# Patient Record
Sex: Female | Born: 1935 | Race: White | Hispanic: No | Smoking: Never smoker
Health system: Southern US, Community
[De-identification: ages and names within clinical notes are randomized; demographics above are authoritative.]

## PROBLEM LIST (undated history)

## (undated) DIAGNOSIS — C4490 Unspecified malignant neoplasm of skin, unspecified: Secondary | ICD-10-CM

## (undated) DIAGNOSIS — R296 Repeated falls: Secondary | ICD-10-CM

## (undated) DIAGNOSIS — E119 Type 2 diabetes mellitus without complications: Secondary | ICD-10-CM

## (undated) DIAGNOSIS — E785 Hyperlipidemia, unspecified: Secondary | ICD-10-CM

## (undated) DIAGNOSIS — M1990 Unspecified osteoarthritis, unspecified site: Secondary | ICD-10-CM

## (undated) DIAGNOSIS — R251 Tremor, unspecified: Secondary | ICD-10-CM

## (undated) DIAGNOSIS — I10 Essential (primary) hypertension: Secondary | ICD-10-CM

## (undated) DIAGNOSIS — K219 Gastro-esophageal reflux disease without esophagitis: Secondary | ICD-10-CM

## (undated) DIAGNOSIS — G43909 Migraine, unspecified, not intractable, without status migrainosus: Secondary | ICD-10-CM

## (undated) DIAGNOSIS — F419 Anxiety disorder, unspecified: Secondary | ICD-10-CM

## (undated) DIAGNOSIS — Z8489 Family history of other specified conditions: Secondary | ICD-10-CM

## (undated) HISTORY — PX: BREAST BIOPSY: SHX20

## (undated) HISTORY — PX: SKIN CANCER EXCISION: SHX779

## (undated) HISTORY — PX: CATARACT EXTRACTION W/ INTRAOCULAR LENS  IMPLANT, BILATERAL: SHX1307

## (undated) HISTORY — PX: APPENDECTOMY: SHX54

## (undated) HISTORY — PX: BLADDER SUSPENSION: SHX72

## (undated) HISTORY — PX: TONSILLECTOMY: SUR1361

## (undated) HISTORY — PX: ABDOMINAL HYSTERECTOMY: SHX81

## (undated) HISTORY — PX: LAPAROSCOPIC CHOLECYSTECTOMY: SUR755

## (undated) HISTORY — PX: DILATION AND CURETTAGE OF UTERUS: SHX78

---

## 2015-09-27 ENCOUNTER — Emergency Department (HOSPITAL_BASED_OUTPATIENT_CLINIC_OR_DEPARTMENT_OTHER): Payer: Medicare Other

## 2015-09-27 ENCOUNTER — Encounter (HOSPITAL_BASED_OUTPATIENT_CLINIC_OR_DEPARTMENT_OTHER): Payer: Self-pay | Admitting: Emergency Medicine

## 2015-09-27 ENCOUNTER — Observation Stay (HOSPITAL_BASED_OUTPATIENT_CLINIC_OR_DEPARTMENT_OTHER)
Admission: EM | Admit: 2015-09-27 | Discharge: 2015-09-29 | Disposition: A | Discharge status: Home / Self Care | Payer: Medicare Other | Attending: Internal Medicine | Admitting: Internal Medicine

## 2015-09-27 DIAGNOSIS — Z79899 Other long term (current) drug therapy: Secondary | ICD-10-CM | POA: Diagnosis not present

## 2015-09-27 DIAGNOSIS — M13849 Other specified arthritis, unspecified hand: Secondary | ICD-10-CM | POA: Diagnosis not present

## 2015-09-27 DIAGNOSIS — K219 Gastro-esophageal reflux disease without esophagitis: Secondary | ICD-10-CM | POA: Diagnosis not present

## 2015-09-27 DIAGNOSIS — G43909 Migraine, unspecified, not intractable, without status migrainosus: Secondary | ICD-10-CM | POA: Diagnosis not present

## 2015-09-27 DIAGNOSIS — R51 Headache: Secondary | ICD-10-CM | POA: Diagnosis present

## 2015-09-27 DIAGNOSIS — R471 Dysarthria and anarthria: Secondary | ICD-10-CM

## 2015-09-27 DIAGNOSIS — F419 Anxiety disorder, unspecified: Secondary | ICD-10-CM | POA: Diagnosis not present

## 2015-09-27 DIAGNOSIS — R519 Headache, unspecified: Secondary | ICD-10-CM

## 2015-09-27 DIAGNOSIS — Z85828 Personal history of other malignant neoplasm of skin: Secondary | ICD-10-CM | POA: Diagnosis not present

## 2015-09-27 DIAGNOSIS — G459 Transient cerebral ischemic attack, unspecified: Secondary | ICD-10-CM | POA: Diagnosis present

## 2015-09-27 DIAGNOSIS — E785 Hyperlipidemia, unspecified: Secondary | ICD-10-CM | POA: Insufficient documentation

## 2015-09-27 DIAGNOSIS — I10 Essential (primary) hypertension: Secondary | ICD-10-CM | POA: Diagnosis not present

## 2015-09-27 DIAGNOSIS — E119 Type 2 diabetes mellitus without complications: Secondary | ICD-10-CM | POA: Insufficient documentation

## 2015-09-27 DIAGNOSIS — R29818 Other symptoms and signs involving the nervous system: Secondary | ICD-10-CM | POA: Diagnosis present

## 2015-09-27 HISTORY — DX: Repeated falls: R29.6

## 2015-09-27 HISTORY — DX: Tremor, unspecified: R25.1

## 2015-09-27 HISTORY — DX: Family history of other specified conditions: Z84.89

## 2015-09-27 HISTORY — DX: Migraine, unspecified, not intractable, without status migrainosus: G43.909

## 2015-09-27 HISTORY — DX: Unspecified osteoarthritis, unspecified site: M19.90

## 2015-09-27 HISTORY — DX: Unspecified malignant neoplasm of skin, unspecified: C44.90

## 2015-09-27 HISTORY — DX: Anxiety disorder, unspecified: F41.9

## 2015-09-27 HISTORY — DX: Essential (primary) hypertension: I10

## 2015-09-27 HISTORY — DX: Hyperlipidemia, unspecified: E78.5

## 2015-09-27 HISTORY — DX: Gastro-esophageal reflux disease without esophagitis: K21.9

## 2015-09-27 HISTORY — DX: Type 2 diabetes mellitus without complications: E11.9

## 2015-09-27 LAB — COMPREHENSIVE METABOLIC PANEL
ALBUMIN: 3.9 g/dL (ref 3.5–5.0)
ALK PHOS: 100 U/L (ref 38–126)
ALT: 19 U/L (ref 14–54)
ANION GAP: 5 (ref 5–15)
AST: 22 U/L (ref 15–41)
BUN: 17 mg/dL (ref 6–20)
CALCIUM: 8.8 mg/dL — AB (ref 8.9–10.3)
CO2: 27 mmol/L (ref 22–32)
Chloride: 102 mmol/L (ref 101–111)
Creatinine, Ser: 0.68 mg/dL (ref 0.44–1.00)
GFR calc non Af Amer: >60 mL/min (ref >60)
GLUCOSE: 163 mg/dL — AB (ref 65–99)
POTASSIUM: 4 mmol/L (ref 3.5–5.1)
SODIUM: 134 mmol/L — AB (ref 135–145)
TOTAL PROTEIN: 6.9 g/dL (ref 6.5–8.1)
Total Bilirubin: 0.4 mg/dL (ref 0.3–1.2)

## 2015-09-27 LAB — CBC
HCT: 34.9 % — ABNORMAL LOW (ref 36.0–46.0)
Hemoglobin: 11.5 g/dL — ABNORMAL LOW (ref 12.0–15.0)
MCH: 31.6 pg (ref 26.0–34.0)
MCHC: 33 g/dL (ref 30.0–36.0)
MCV: 95.9 fL (ref 78.0–100.0)
PLATELETS: 174 10*3/uL (ref 150–400)
RBC: 3.64 MIL/uL — ABNORMAL LOW (ref 3.87–5.11)
RDW: 12.5 % (ref 11.5–15.5)
WBC: 5.4 10*3/uL (ref 4.0–10.5)

## 2015-09-27 LAB — DIFFERENTIAL
Basophils Absolute: 0 10*3/uL (ref 0.0–0.1)
Basophils Relative: 0 %
EOS ABS: 0.1 10*3/uL (ref 0.0–0.7)
EOS PCT: 3 %
Lymphocytes Relative: 27 %
Lymphs Abs: 1.5 10*3/uL (ref 0.7–4.0)
MONO ABS: 0.4 10*3/uL (ref 0.1–1.0)
Monocytes Relative: 7 %
NEUTROS PCT: 63 %
Neutro Abs: 3.4 10*3/uL (ref 1.7–7.7)

## 2015-09-27 LAB — URINALYSIS, ROUTINE W REFLEX MICROSCOPIC
Bilirubin Urine: NEGATIVE
GLUCOSE, UA: NEGATIVE mg/dL
HGB URINE DIPSTICK: NEGATIVE
Ketones, ur: NEGATIVE mg/dL
Nitrite: NEGATIVE
PH: 6.5 (ref 5.0–8.0)
Protein, ur: NEGATIVE mg/dL
Specific Gravity, Urine: 1.009 (ref 1.005–1.030)
Urobilinogen, UA: 0.2 mg/dL (ref 0.0–1.0)

## 2015-09-27 LAB — TROPONIN I: Troponin I: <0.03 ng/mL (ref <0.031)

## 2015-09-27 LAB — RAPID URINE DRUG SCREEN, HOSP PERFORMED
AMPHETAMINES: NOT DETECTED
BARBITURATES: NOT DETECTED
BENZODIAZEPINES: NOT DETECTED
COCAINE: NOT DETECTED
Opiates: NOT DETECTED
Tetrahydrocannabinol: NOT DETECTED

## 2015-09-27 LAB — PROTIME-INR
INR: 1 (ref 0.00–1.49)
PROTHROMBIN TIME: 13.4 s (ref 11.6–15.2)

## 2015-09-27 LAB — ETHANOL

## 2015-09-27 LAB — URINE MICROSCOPIC-ADD ON

## 2015-09-27 LAB — APTT: aPTT: 32 seconds (ref 24–37)

## 2015-09-27 MED ORDER — DIPHENHYDRAMINE HCL 50 MG/ML IJ SOLN
25.0000 mg | Freq: Once | INTRAMUSCULAR | Status: DC
  Filled 2015-09-27: qty 1

## 2015-09-27 MED ORDER — SODIUM CHLORIDE 0.9 % IV BOLUS (SEPSIS)
1000.0000 mL | Freq: Once | INTRAVENOUS | Status: AC
  Administered 2015-09-27: 1000 mL via INTRAVENOUS

## 2015-09-27 MED ORDER — DEXAMETHASONE SODIUM PHOSPHATE 10 MG/ML IJ SOLN
10.0000 mg | Freq: Once | INTRAMUSCULAR | Status: DC
  Filled 2015-09-27: qty 1

## 2015-09-27 MED ORDER — METOCLOPRAMIDE HCL 5 MG/ML IJ SOLN
10.0000 mg | Freq: Once | INTRAMUSCULAR | Status: DC
  Filled 2015-09-27: qty 2

## 2015-09-27 NOTE — ED Provider Notes (Signed)
CSN: [id number]     Arrival date & time 09/27/15  1916 History  By signing my name below, I, Tula Nakayama, attest that this documentation has been prepared under the direction and in the presence of Merrily Pew, MD.  Electronically Signed: Tula Nakayama, ED Scribe. 09/27/2015. 7:34 PM.  Chief Complaint  Patient presents with  . Cerebrovascular Accident   The history is provided by the patient and a relative. No language interpreter was used.   HPI Comments: Novis League is a 81 y.o. female with a history of DM and HTN, brought in by her son who presents to the Emergency Department complaining of intermittent, moderate slurred speech that started within the last 2 hours. She states a "heavy" feeling in her head as an associated symptom. Pt also reports  HA behind her left-eye and blurred vision, which occurred with the speech difficulty but are resolved.   Pt's son reports he last talked to her 2 hours ago and she was speaking normally. Her neighbor called 1 hour and 15 minutes ago and told him that she had slurred speech and difficulty forming sentences. He states that she is speaking normally now. Pt reports a history of migraines that normally occur behind her right eye. She notes that blurred vision and slurred speech are consistent with prior migraines, but states she has not had a similar headache in a long time. Pt's son also notes healing bruises on her forehead after she fell onto pavement 1 week ago. She was seen by [facility] after the fall who did a CT Head and an x-ray of her left arm, which were normal. Pt denies a history of CVA. She also denies any current pain.   Past Medical History  Diagnosis Date  . Diabetes mellitus without complication ([place])   . Hypertension    Past Surgical History  Procedure Laterality Date  . Cholecystectomy    . Abdominal hysterectomy    . Bladder tack    . Tremors     History reviewed. No pertinent family history. Social History  Substance  Use Topics  . Smoking status: Never Smoker   . Smokeless tobacco: Never Used  . Alcohol Use: No   OB History    No data available     Review of Systems  Eyes: Positive for visual disturbance.  Skin: Positive for wound.  Neurological: Positive for speech difficulty and headaches.  All other systems reviewed and are negative.   Allergies  Review of patient's allergies indicates no known allergies.  Home Medications   Prior to Admission medications   Not on File   BP 177/66 mmHg  Pulse 75  Temp(Src) 98.2 F (36.8 C)  Resp 18  Ht 5\' 1"  (1.549 m)  Wt 145 lb (65.772 kg)  BMI 27.41 kg/m2  SpO2 99% Physical Exam  Constitutional: She is oriented to person, place, and time. She appears well-developed and well-nourished. No distress.  HENT:  Head: Normocephalic and atraumatic.  Eyes: Conjunctivae and EOM are normal.  Neck: Neck supple. No tracheal deviation present.  Cardiovascular: Normal rate, regular rhythm and normal heart sounds.   Pulmonary/Chest: Effort normal and breath sounds normal. No respiratory distress. She has no wheezes.  Neurological: She is alert and oriented to person, place, and time. No cranial nerve deficit.  Cranial nerves intact Equal upper strength bilaterally Equal lower strength and sensation bilaterally  Skin: Skin is warm and dry.  Psychiatric: She has a normal mood and affect. Her behavior is normal.  Nursing  note and vitals reviewed.   ED Course  Procedures   DIAGNOSTIC STUDIES: Oxygen Saturation is 99% on RA, normal by my interpretation.    COORDINATION OF CARE: 7:32 PM Discussed treatment plan with pt at bedside and pt agreed to plan.  Labs Review Labs Reviewed  CBC - Abnormal; Notable for the following:    RBC 3.64 (*)    Hemoglobin 11.5 (*)    HCT 34.9 (*)    All other components within normal limits  COMPREHENSIVE METABOLIC PANEL - Abnormal; Notable for the following:    Sodium 134 (*)    Glucose, Bld 163 (*)    Calcium 8.8  (*)    All other components within normal limits  URINALYSIS, ROUTINE W REFLEX MICROSCOPIC (NOT AT [facility]) - Abnormal; Notable for the following:    Leukocytes, UA TRACE (*)    All other components within normal limits  ETHANOL  PROTIME-INR  APTT  DIFFERENTIAL  URINE RAPID DRUG SCREEN, HOSP PERFORMED  TROPONIN I  URINE MICROSCOPIC-ADD ON    Imaging Review Ct Head Wo Contrast  09/27/2015  CLINICAL DATA:  81 year old female with complaint of severe headache and blurry vision. Dysarthria. EXAM: CT HEAD WITHOUT CONTRAST TECHNIQUE: Contiguous axial images were obtained from the base of the skull through the vertex without intravenous contrast. COMPARISON:  No priors. FINDINGS: Mild cerebral atrophy. Patchy and confluent areas of decreased attenuation are noted throughout the deep and periventricular white matter of the cerebral hemispheres bilaterally, compatible with chronic microvascular ischemic disease. No acute intracranial abnormalities. Specifically, no evidence of acute intracranial hemorrhage, no definite findings of acute/subacute cerebral ischemia, no mass, mass effect, hydrocephalus or abnormal intra or extra-axial fluid collections. Visualized paranasal sinuses and mastoids are well pneumatized. No acute displaced skull fractures are identified. IMPRESSION: 1. No acute intracranial abnormalities. 2. Mild cerebral atrophy with chronic microvascular ischemic changes throughout the cerebral white matter bilaterally, as above. Electronically Signed   By: Vinnie Langton M.D.   On: 09/27/2015 20:24   I have personally reviewed and evaluated these lab results as part of my medical decision-making.   EKG Interpretation   Date/Time:  Tuesday September 27 2015 20:39:17 EDT Ventricular Rate:  76 PR Interval:  152 QRS Duration: 78 QT Interval:  376 QTC Calculation: 423 R Axis:   1 Text Interpretation:  Normal sinus rhythm Septal infarct , age  undetermined Abnormal ECG Confirmed by [facility]  MD, Corene Cornea [phone]) on  09/27/2015 10:31:56 PM      MDM   Final diagnoses:  Nonintractable headache, unspecified chronicity pattern, unspecified headache type  Neurologic abnormality   81 year old female without much medical history presents to the ED with likely, Migraine versus TIA. Symptoms resolved prior to arrival here so no headache cocktail given. Workup negative for any other causes for her symptoms. Discussed case with the neurology team and recommended overnight TIA observation. D/W hospitalist and will obs.   I personally performed the services described in this documentation, which was scribed in my presence. The recorded information has been reviewed and is accurate.   Merrily Pew, MD 09/27/15 2232

## 2015-09-27 NOTE — ED Notes (Signed)
Pt began having difficulty finishing per sentences and slurred speech LSN 5:30 today symptoms currently resolving

## 2015-09-27 NOTE — ED Notes (Signed)
Pt a&o   Moves all ext,  Grips equal bilateral  Pupils 3 and brisk

## 2015-09-27 NOTE — ED Notes (Signed)
Pt was having slurred speech and diff completing sentences, and ha,  At present speech is clear  Talking complete sentences  States ha is gone just a heavy feeling in forehead

## 2015-09-28 ENCOUNTER — Observation Stay (HOSPITAL_BASED_OUTPATIENT_CLINIC_OR_DEPARTMENT_OTHER): Payer: Medicare Other

## 2015-09-28 ENCOUNTER — Encounter (HOSPITAL_COMMUNITY): Payer: Self-pay | Admitting: General Practice

## 2015-09-28 ENCOUNTER — Observation Stay (HOSPITAL_COMMUNITY): Payer: Medicare Other

## 2015-09-28 DIAGNOSIS — G43909 Migraine, unspecified, not intractable, without status migrainosus: Secondary | ICD-10-CM | POA: Diagnosis not present

## 2015-09-28 DIAGNOSIS — R471 Dysarthria and anarthria: Secondary | ICD-10-CM

## 2015-09-28 DIAGNOSIS — G459 Transient cerebral ischemic attack, unspecified: Secondary | ICD-10-CM | POA: Diagnosis present

## 2015-09-28 DIAGNOSIS — R29818 Other symptoms and signs involving the nervous system: Secondary | ICD-10-CM

## 2015-09-28 LAB — LIPID PANEL
CHOL/HDL RATIO: 2.4 ratio
Cholesterol: 103 mg/dL (ref 0–200)
HDL: 43 mg/dL (ref >40)
LDL CALC: 45 mg/dL (ref 0–99)
Triglycerides: 75 mg/dL (ref <150)
VLDL: 15 mg/dL (ref 0–40)

## 2015-09-28 LAB — GLUCOSE, CAPILLARY
Glucose-Capillary: 102 mg/dL — ABNORMAL HIGH (ref 65–99)
Glucose-Capillary: 105 mg/dL — ABNORMAL HIGH (ref 65–99)
Glucose-Capillary: 105 mg/dL — ABNORMAL HIGH (ref 65–99)
Glucose-Capillary: 88 mg/dL (ref 65–99)

## 2015-09-28 MED ORDER — ACETAMINOPHEN 500 MG PO TABS: 500.0000 mg | ORAL_TABLET | Freq: Four times a day (QID) | ORAL | Status: DC | PRN

## 2015-09-28 MED ORDER — INSULIN ASPART 100 UNIT/ML ~~LOC~~ SOLN
0.0000 [IU] | Freq: Three times a day (TID) | SUBCUTANEOUS | Status: DC
  Administered 2015-09-29: 2 [IU] via SUBCUTANEOUS

## 2015-09-28 MED ORDER — ASPIRIN 81 MG PO CHEW
81.0000 mg | CHEWABLE_TABLET | Freq: Once | ORAL | Status: AC
  Administered 2015-09-28: 81 mg via ORAL
  Filled 2015-09-28: qty 1

## 2015-09-28 MED ORDER — ENOXAPARIN SODIUM 30 MG/0.3ML ~~LOC~~ SOLN
30.0000 mg | SUBCUTANEOUS | Status: DC
  Administered 2015-09-29: 30 mg via SUBCUTANEOUS
  Filled 2015-09-28: qty 0.3

## 2015-09-28 MED ORDER — SENNOSIDES-DOCUSATE SODIUM 8.6-50 MG PO TABS: 1.0000 | ORAL_TABLET | Freq: Every evening | ORAL | Status: DC | PRN

## 2015-09-28 MED ORDER — STROKE: EARLY STAGES OF RECOVERY BOOK
Freq: Once | Status: AC
  Administered 2015-09-28: 04:00:00
  Filled 2015-09-28: qty 1

## 2015-09-28 NOTE — Progress Notes (Signed)
Patient Demographics  Rebekah Williams, is a 80 y.o. female, DOB - [birth year], SAY:[id number]  Admit date - 09/27/2015   Admitting Physician Edwin Dada, MD  Outpatient Primary MD for the patient is Yong Channel, MD  LOS -    Chief Complaint  Patient presents with  . Cerebrovascular Accident         Subjective:   Rebekah Williams today has, No headache, No chest pain, No abdominal pain - No Nausea, No new weakness tingling or numbness, No Cough - SOB.   Assessment & Plan    Principal Problem:   TIA (transient ischemic attack) Active Problems:   Neurologic abnormality  An episode of dysarthria, confusion and blurry vision - This is most likely related to migraine headache, as symptoms resembles previous episodes of migraines(difference is blurry vision on both eyes this time, and dysarthria is more pronounced), MRI brain with no acute infarct, 2-D echo pending, carotid Doppler pending. - Continue with aspirin pending further workup  Code Status: Full  Family Communication: Family at bedside  Disposition Plan: Home when stable   Procedures  None  Consults   None   Medications  Scheduled Meds: . enoxaparin (LOVENOX) injection  30 mg Subcutaneous Q24H  . insulin aspart  0-9 Units Subcutaneous TID WC   Continuous Infusions:  PRN Meds:.acetaminophen, senna-docusate  DVT Prophylaxis  Lovenox -   Lab Results  Component Value Date   PLT 174 09/27/2015    Antibiotics   Anti-infectives    None          Objective:   Filed Vitals:   09/27/15 2149 09/27/15 2331 09/28/15 0451 09/28/15 1314  BP: 177/66 176/62 164/65 163/56  Pulse: 75 77 66 68  Temp:  98.2 F (36.8 C) 98.2 F (36.8 C) 98.6 F (37 C)  TempSrc:  Oral Oral Oral  Resp: 18 18 18 16   Height: 5\' 1"  (1.549 m)     Weight: 65.772 kg (145 lb)     SpO2: 99% 99% 98% 99%    Wt Readings from Last 3  Encounters:  09/27/15 65.772 kg (145 lb)     Intake/Output Summary (Last 24 hours) at 09/28/15 1509 Last data filed at 09/28/15 1227  Gross per 24 hour  Intake   1000 ml  Output   1350 ml  Net   -350 ml     Physical Exam  Awake Alert, Oriented X 3, No new F.N deficits, Normal affect [place].AT,PERRAL Supple Neck,No JVD, No cervical lymphadenopathy appriciated.  Symmetrical Chest wall movement, Good air movement bilaterally, CTAB RRR,No Gallops,Rubs or new Murmurs, No Parasternal Heave +ve B.Sounds, Abd Soft, No tenderness, No organomegaly appriciated, No rebound - guarding or rigidity. No Cyanosis, Clubbing or edema, No new Rash or bruise    Data Review   Micro Results No results found for this or any previous visit (from the past 240 hour(s)).  Radiology Reports Ct Head Wo Contrast  09/27/2015  CLINICAL DATA:  81 year old female with complaint of severe headache and blurry vision. Dysarthria. EXAM: CT HEAD WITHOUT CONTRAST TECHNIQUE: Contiguous axial images were obtained from the base of the skull through the vertex without intravenous contrast. COMPARISON:  No priors. FINDINGS: Mild cerebral atrophy. Patchy and confluent areas of decreased attenuation are  noted throughout the deep and periventricular white matter of the cerebral hemispheres bilaterally, compatible with chronic microvascular ischemic disease. No acute intracranial abnormalities. Specifically, no evidence of acute intracranial hemorrhage, no definite findings of acute/subacute cerebral ischemia, no mass, mass effect, hydrocephalus or abnormal intra or extra-axial fluid collections. Visualized paranasal sinuses and mastoids are well pneumatized. No acute displaced skull fractures are identified. IMPRESSION: 1. No acute intracranial abnormalities. 2. Mild cerebral atrophy with chronic microvascular ischemic changes throughout the cerebral white matter bilaterally, as above. Electronically Signed   By: Vinnie Langton M.D.    On: 09/27/2015 20:24   Mr Brain Wo Contrast  09/28/2015  CLINICAL DATA:  Headache and slurred speech. Fall 2 weeks ago with facial bruising. EXAM: MRI HEAD WITHOUT CONTRAST TECHNIQUE: Multiplanar, multiecho pulse sequences of the brain and surrounding structures were obtained without intravenous contrast. COMPARISON:  Head CT 09/27/2015 FINDINGS: Some sequences are mildly to moderately motion degraded. There is no evidence of acute infarct, intracranial hemorrhage, mass, midline shift, or extra-axial fluid collection. Mild generalized cerebral atrophy is within normal limits for age. Foci of T2 hyperintensity in the subcortical and deep cerebral white matter bilaterally and pons are compatible with mild-to-moderate chronic small vessel ischemic disease. A chronic lacunar infarct is noted in the left thalamus. Prior bilateral cataract extraction is noted. Paranasal sinuses are clear. There is a small right mastoid effusion. Major intracranial vascular flow voids are preserved. IMPRESSION: 1. No acute intracranial abnormality. 2. Mild-to-moderate chronic small vessel ischemic disease. Electronically Signed   By: Logan Bores M.D.   On: 09/28/2015 12:01     CBC  Recent Labs Lab 09/27/15 1950  WBC 5.4  HGB 11.5*  HCT 34.9*  PLT 174  MCV 95.9  MCH 31.6  MCHC 33.0  RDW 12.5  LYMPHSABS 1.5  MONOABS 0.4  EOSABS 0.1  BASOSABS 0.0    Chemistries   Recent Labs Lab 09/27/15 1950  NA 134*  K 4.0  CL 102  CO2 27  GLUCOSE 163*  BUN 17  CREATININE 0.68  CALCIUM 8.8*  AST 22  ALT 19  ALKPHOS 100  BILITOT 0.4   ------------------------------------------------------------------------------------------------------------------ estimated creatinine clearance is 35.7 mL/min (by C-G formula based on Cr of 0.68). ------------------------------------------------------------------------------------------------------------------ No results for input(s): HGBA1C in the last 72  hours. ------------------------------------------------------------------------------------------------------------------  Recent Labs  09/28/15 0529  CHOL 103  HDL 43  LDLCALC 45  TRIG 75  CHOLHDL 2.4   ------------------------------------------------------------------------------------------------------------------ No results for input(s): TSH, T4TOTAL, T3FREE, THYROIDAB in the last 72 hours.  Invalid input(s): FREET3 ------------------------------------------------------------------------------------------------------------------ No results for input(s): VITAMINB12, FOLATE, FERRITIN, TIBC, IRON, RETICCTPCT in the last 72 hours.  Coagulation profile  Recent Labs Lab 09/27/15 1950  INR 1.00    No results for input(s): DDIMER in the last 72 hours.  Cardiac Enzymes  Recent Labs Lab 09/27/15 1950  TROPONINI <0.03   ------------------------------------------------------------------------------------------------------------------ Invalid input(s): POCBNP     Time Spent in minutes   No Sherlyn Lees, Leemon Ayala M.D on 09/28/2015 at 3:09 PM  Between 7am to 7pm - Pager - [phone]  After 7pm go to [email or web address] - password [facility]  [facility]   Office  [phone]

## 2015-09-28 NOTE — H&P (Signed)
History and Physical  Rebekah Williams CBU:[id number] DOB: Jul 21, 1917 DOA: 09/27/2015  PCP: Yong Channel, MD   Chief Complaint: headache and slurred speech  History of Present Illness:  Patient is a 46 pleasant female with history of migraines, HTN, DM who was transferred from outside hospital for TIA observation. She was in her usual state of health until past afternoon when she started having frontal headache bilaterally, blurry vision and slurred speech. This lasted couple of hours before it was resolved. She had blurry vision and unilateral headache frequently in the past with migraines and had slurred speech couple of times as well but it was rare that made her and her son worry about a possible TIA/stroke. She denies a history of CVA or CAD. Neurology recommended overnight obs admission for TIA. She has no other complaints. Symptoms were resolved.   Review of Systems:  CONSTITUTIONAL:  No night sweats.  No fatigue, malaise, lethargy.  No fever or chills. Eyes:  No visual changes.  No eye pain.  No eye discharge.   ENT:    No epistaxis.  No sinus pain.  No sore throat.  No ear pain.  No congestion. RESPIRATORY:  No cough.  No wheeze.  No hemoptysis.  No shortness of breath. CARDIOVASCULAR:  No chest pains.  No palpitations. GASTROINTESTINAL:  No abdominal pain.  No nausea or vomiting.  No diarrhea or constipation.  No hematemesis.  No hematochezia.  No melena. GENITOURINARY:  No urgency.  No frequency.  No dysuria.  No hematuria.  No obstructive symptoms.  No discharge.  No pain.  No significant abnormal bleeding. MUSCULOSKELETAL:  No musculoskeletal pain.  No joint swelling.  No arthritis. NEUROLOGICAL:  No confusion.  No weakness. No headache. No seizure. PSYCHIATRIC:  No depression. No anxiety. No suicidal ideation. SKIN:  No rashes.  No lesions.  No wounds. ENDOCRINE:  No unexplained weight loss.  No polydipsia.  No polyuria.  No polyphagia. HEMATOLOGIC:  No anemia.  No  purpura.  No petechiae.  No bleeding.  ALLERGIC AND IMMUNOLOGIC:  No pruritus.  No swelling Other:  Past Medical and Surgical History:   Past Medical History  Diagnosis Date  . Hypertension   . Tremor   . Skin cancer     "right leg; from sun exposure"  . Family history of adverse reaction to anesthesia     "my sister; I don't know the particulars on it"  . Hyperlipidemia   . Type II diabetes mellitus ([place])   . GERD (gastroesophageal reflux disease)   . Migraine     "maybe 4-5/year" (09/28/2015)  . Arthritis     "fingers" (09/28/2015)  . Frequent falls     "2-3 falls in the last year; last one was 09/17/2015" (09/28/2015)  . Anxiety    Past Surgical History  Procedure Laterality Date  . Bladder suspension    . Dilation and curettage of uterus    . Tonsillectomy    . Appendectomy    . Laparoscopic cholecystectomy    . Abdominal hysterectomy    . Breast biopsy Right     "biopsy"  . Skin cancer excision Right     "leg"  . Cataract extraction w/ intraocular lens  implant, bilateral Bilateral     Social History:   reports that she has never smoked. She has never used smokeless tobacco. She reports that she does not drink alcohol or use illicit drugs.   Allergies  Allergen Reactions  . Chocolate Other (See Comments)    "I  have a migraine headache"    History reviewed. No pertinent family history.    Prior to Admission medications   Not on File    Physical Exam: BP 176/62 mmHg  Pulse 77  Temp(Src) 98.2 F (36.8 C) (Oral)  Resp 18  Ht 5\' 1"  (1.549 m)  Wt 65.772 kg (145 lb)  BMI 27.41 kg/m2  SpO2 99%  GENERAL : Well developed, well nourished, alert and cooperative, and appears to be in no acute distress. HEAD: normocephalic. EYES: PERRL, EOMI. Fundi normal, vision is grossly intact. EARS: hard hearing NOSE: No nasal discharge. THROAT: Oral cavity and pharynx normal.  NECK: Neck supple CARDIAC: Normal S1 and S2. No S3, S4 or murmurs. Rhythm is regular.  There is no peripheral edema LUNGS: Clear to auscultation and percussion without rales, rhonchi, wheezing or diminished breath sounds. ABDOMEN: Positive bowel sounds. Soft, nondistended, mild epigastric tenderness to deep palpation. No guarding or rebound. No masses. EXTREMITIES: No significant deformity or joint abnormality. No edema.  NEUROLOGICAL: The mental examination revealed the patient was oriented to person, place, and time.CN II-XII intact. Strength and sensation symmetric and intact throughout. SKIN: Skin normal color, texture and turgor with no lesions or eruptions. PSYCHIATRIC:  The patient was able to demonstrate good judgement and reason, without hallucinations, abnormal affect or abnormal behaviors during the examination. Patient is not suicidal.          Labs on Admission:  Reviewed.   Radiological Exams on Admission: Ct Head Wo Contrast  09/27/2015  CLINICAL DATA:  81 year old female with complaint of severe headache and blurry vision. Dysarthria. EXAM: CT HEAD WITHOUT CONTRAST TECHNIQUE: Contiguous axial images were obtained from the base of the skull through the vertex without intravenous contrast. COMPARISON:  No priors. FINDINGS: Mild cerebral atrophy. Patchy and confluent areas of decreased attenuation are noted throughout the deep and periventricular white matter of the cerebral hemispheres bilaterally, compatible with chronic microvascular ischemic disease. No acute intracranial abnormalities. Specifically, no evidence of acute intracranial hemorrhage, no definite findings of acute/subacute cerebral ischemia, no mass, mass effect, hydrocephalus or abnormal intra or extra-axial fluid collections. Visualized paranasal sinuses and mastoids are well pneumatized. No acute displaced skull fractures are identified. IMPRESSION: 1. No acute intracranial abnormalities. 2. Mild cerebral atrophy with chronic microvascular ischemic changes throughout the cerebral white matter bilaterally,  as above. Electronically Signed   By: Vinnie Langton M.D.   On: 09/27/2015 20:24    EKG:  Independently reviewed. NSR  Assessment/Plan  TIA vs. Migraine with neurological symptoms:  Resolved.  Started on asp 81 mg Tolerating BP < 180 MRI/MRA may not change management. Patient not surgical candidate Will check Echo Consult Neuro in am.   HTN: permissive HTN up to 161 systolic, son to bring her BP meds list tomorrow  DM: on oral meds at home ( does not remember them), will place on low dose SS insulin.   Migraines: Tylenol prn  DVT prophylaxis: [place] enoxaparin     Gennaro Africa M.D [facility]

## 2015-09-29 ENCOUNTER — Observation Stay (HOSPITAL_BASED_OUTPATIENT_CLINIC_OR_DEPARTMENT_OTHER): Payer: Medicare Other

## 2015-09-29 DIAGNOSIS — G43809 Other migraine, not intractable, without status migrainosus: Secondary | ICD-10-CM

## 2015-09-29 DIAGNOSIS — G43909 Migraine, unspecified, not intractable, without status migrainosus: Secondary | ICD-10-CM

## 2015-09-29 DIAGNOSIS — G459 Transient cerebral ischemic attack, unspecified: Secondary | ICD-10-CM

## 2015-09-29 LAB — GLUCOSE, CAPILLARY
Glucose-Capillary: 120 mg/dL — ABNORMAL HIGH (ref 65–99)
Glucose-Capillary: 182 mg/dL — ABNORMAL HIGH (ref 65–99)

## 2015-09-29 NOTE — Progress Notes (Signed)
  Echocardiogram 2D Echocardiogram has been performed.  Donata Clay 09/29/2015, 10:11 AM

## 2015-09-29 NOTE — Discharge Instructions (Signed)
Follow with Primary MD CABEZA,YURI, MD in 7 days   Get CBC, CMP, 2 view Chest X ray checked  by Primary MD next visit.    Activity: As tolerated with Full fall precautions use walker/cane & assistance as needed   Disposition Home    Diet: Heart Healthy  , with feeding assistance and aspiration precautions.  For Heart failure patients - Check your Weight same time everyday, if you gain over 2 pounds, or you develop in leg swelling, experience more shortness of breath or chest pain, call your Primary MD immediately. Follow Cardiac Low Salt Diet and 1.5 lit/day fluid restriction.   On your next visit with your primary care physician please Get Medicines reviewed and adjusted.   Please request your Prim.MD to go over all Hospital Tests and Procedure/Radiological results at the follow up, please get all Hospital records sent to your Prim MD by signing hospital release before you go home.   If you experience worsening of your admission symptoms, develop shortness of breath, life threatening emergency, suicidal or homicidal thoughts you must seek medical attention immediately by calling 911 or calling your MD immediately  if symptoms less severe.  You Must read complete instructions/literature along with all the possible adverse reactions/side effects for all the Medicines you take and that have been prescribed to you. Take any new Medicines after you have completely understood and accpet all the possible adverse reactions/side effects.   Do not drive, operating heavy machinery, perform activities at heights, swimming or participation in water activities or provide baby sitting services if your were admitted for syncope or siezures until you have seen by Primary MD or a Neurologist and advised to do so again.  Do not drive when taking Pain medications.    Do not take more than prescribed Pain, Sleep and Anxiety Medications  Special Instructions: If you have smoked or chewed Tobacco  in the  last 2 yrs please stop smoking, stop any regular Alcohol  and or any Recreational drug use.  Wear Seat belts while driving.   Please note  You were cared for by a hospitalist during your hospital stay. If you have any questions about your discharge medications or the care you received while you were in the hospital after you are discharged, you can call the unit and asked to speak with the hospitalist on call if the hospitalist that took care of you is not available. Once you are discharged, your primary care physician will handle any further medical issues. Please note that NO REFILLS for any discharge medications will be authorized once you are discharged, as it is imperative that you return to your primary care physician (or establish a relationship with a primary care physician if you do not have one) for your aftercare needs so that they can reassess your need for medications and monitor your lab values.

## 2015-09-29 NOTE — Discharge Summary (Addendum)
Nansi Birmingham, is a 81 y.o. female  DOB 1917-08-02  MRN [id number].  Admission date:  09/27/2015  Admitting Physician  Edwin Dada, MD  Discharge Date:  09/29/2015   Primary MD  Yong Channel, MD  Recommendations for primary care physician for things to follow:  - Patient to follow his with her PCP within 1 week   Admission Diagnosis  Neurologic abnormality [R29.818] Nonintractable headache, unspecified chronicity pattern, unspecified headache type [R51]   Discharge Diagnosis  Neurologic abnormality [R29.818] Nonintractable headache, unspecified chronicity pattern, unspecified headache type [R51]    Active Problems:   Neurologic abnormality   Migraine      Past Medical History  Diagnosis Date  . Hypertension   . Tremor   . Skin cancer     "right leg; from sun exposure"  . Family history of adverse reaction to anesthesia     "my sister; I don't know the particulars on it"  . Hyperlipidemia   . Type II diabetes mellitus ([place])   . GERD (gastroesophageal reflux disease)   . Migraine     "maybe 4-5/year" (09/28/2015)  . Arthritis     "fingers" (09/28/2015)  . Frequent falls     "2-3 falls in the last year; last one was 09/17/2015" (09/28/2015)  . Anxiety     Past Surgical History  Procedure Laterality Date  . Bladder suspension    . Dilation and curettage of uterus    . Tonsillectomy    . Appendectomy    . Laparoscopic cholecystectomy    . Abdominal hysterectomy    . Breast biopsy Right     "biopsy"  . Skin cancer excision Right     "leg"  . Cataract extraction w/ intraocular lens  implant, bilateral Bilateral        History of present illness and  Hospital Course:     Kindly see H&P for history of present illness and admission details, please review complete Labs, Consult reports and Test reports for all details in brief  HPI  from the history and physical done  on the day of admission  Patient is a 66 pleasant female with history of migraines, HTN, DM who was transferred from outside hospital for TIA observation. She was in her usual state of health until past afternoon when she started having frontal headache bilaterally, blurry vision and slurred speech. This lasted couple of hours before it was resolved. She had blurry vision and unilateral headache frequently in the past with migraines and had slurred speech couple of times as well but it was rare that made her and her son worry about a possible TIA/stroke. She denies a history of CVA or CAD. Neurology recommended overnight obs admission for TIA. She has no other complaints. Symptoms were resolved.   Hospital Course   An episode of dysarthria, confusion and blurry vision - This is related to her migraine headache, as symptoms less impulsive to previous episodes of migraine (this time is more intense, and blurry vision in both eyes and he stated  of right eye and dysarthria more pronounced), patient was admitted for further workup, MRI brain was done, with no acute findings, 2-D echo was done, EF 60-65%, normal wall motion, and grade 1 diastolic dysfunction, carotid Doppler was done with no significant ICA stenosis.  Hypertension - Continue home medications  Diabetes mellitus - Was on insulin sliding scale during hospital stay, resume home medication on discharge  Hyperlipidemia - Continue with statin  Discharge Condition:  Stable  Follow UP  Follow-up Information    Follow up with CABEZA,YURI, MD. Call in 1 week.   Specialty:  Internal Medicine   Why:  Posthospitalization follow-up   Contact information:   [address] [place] [address] [phone]         Discharge Instructions  and  Discharge Medications         Discharge Instructions    Diet - low sodium heart healthy    Complete by:  As directed      Discharge instructions    Complete by:  As directed     Follow with Primary MD CABEZA,YURI, MD in 7 days   Get CBC, CMP, 2 view Chest X ray checked  by Primary MD next visit.    Activity: As tolerated with Full fall precautions use walker/cane & assistance as needed   Disposition Home **   Diet: Heart Healthy ** , with feeding assistance and aspiration precautions.  For Heart failure patients - Check your Weight same time everyday, if you gain over 2 pounds, or you develop in leg swelling, experience more shortness of breath or chest pain, call your Primary MD immediately. Follow Cardiac Low Salt Diet and 1.5 lit/day fluid restriction.   On your next visit with your primary care physician please Get Medicines reviewed and adjusted.   Please request your Prim.MD to go over all Hospital Tests and Procedure/Radiological results at the follow up, please get all Hospital records sent to your Prim MD by signing hospital release before you go home.   If you experience worsening of your admission symptoms, develop shortness of breath, life threatening emergency, suicidal or homicidal thoughts you must seek medical attention immediately by calling 911 or calling your MD immediately  if symptoms less severe.  You Must read complete instructions/literature along with all the possible adverse reactions/side effects for all the Medicines you take and that have been prescribed to you. Take any new Medicines after you have completely understood and accpet all the possible adverse reactions/side effects.   Do not drive, operating heavy machinery, perform activities at heights, swimming or participation in water activities or provide baby sitting services if your were admitted for syncope or siezures until you have seen by Primary MD or a Neurologist and advised to do so again.  Do not drive when taking Pain medications.    Do not take more than prescribed Pain, Sleep and Anxiety Medications  Special Instructions: If you have smoked or chewed Tobacco   in the last 2 yrs please stop smoking, stop any regular Alcohol  and or any Recreational drug use.  Wear Seat belts while driving.   Please note  You were cared for by a hospitalist during your hospital stay. If you have any questions about your discharge medications or the care you received while you were in the hospital after you are discharged, you can call the unit and asked to speak with the hospitalist on call if the hospitalist that took care of you is not  available. Once you are discharged, your primary care physician will handle any further medical issues. Please note that NO REFILLS for any discharge medications will be authorized once you are discharged, as it is imperative that you return to your primary care physician (or establish a relationship with a primary care physician if you do not have one) for your aftercare needs so that they can reassess your need for medications and monitor your lab values.     Increase activity slowly    Complete by:  As directed             Medication List    TAKE these medications        amLODipine 5 MG tablet  Commonly known as:  NORVASC  Take 5 mg by mouth daily.     captopril 50 MG tablet  Commonly known as:  CAPOTEN  Take 50 mg by mouth 3 (three) times daily.     escitalopram 10 MG tablet  Commonly known as:  LEXAPRO  Take 10 mg by mouth daily.     furosemide 40 MG tablet  Commonly known as:  LASIX  Take 40 mg by mouth daily.     glipiZIDE 2.5 MG 24 hr tablet  Commonly known as:  GLUCOTROL XL  Take 2.5 mg by mouth daily with breakfast.     multivitamins with iron Tabs tablet  Take 1 tablet by mouth daily.     MYRBETRIQ 50 MG Tb24 tablet  Generic drug:  mirabegron ER  Take 50 mg by mouth daily.     omeprazole 40 MG capsule  Commonly known as:  PRILOSEC  Take 40 mg by mouth 2 (two) times daily.     potassium chloride SA 20 MEQ tablet  Commonly known as:  K-DUR,KLOR-CON  Take 20 mEq by mouth daily.     propranolol 10  MG tablet  Commonly known as:  INDERAL  Take 10 mg by mouth 3 (three) times daily.     simvastatin 20 MG tablet  Commonly known as:  ZOCOR  Take 20 mg by mouth daily.          Diet and Activity recommendation: See Discharge Instructions above   Consults obtained -  None   Major procedures and Radiology Reports - PLEASE review detailed and final reports for all details, in brief -      Ct Head Wo Contrast  09/27/2015  CLINICAL DATA:  81 year old female with complaint of severe headache and blurry vision. Dysarthria. EXAM: CT HEAD WITHOUT CONTRAST TECHNIQUE: Contiguous axial images were obtained from the base of the skull through the vertex without intravenous contrast. COMPARISON:  No priors. FINDINGS: Mild cerebral atrophy. Patchy and confluent areas of decreased attenuation are noted throughout the deep and periventricular white matter of the cerebral hemispheres bilaterally, compatible with chronic microvascular ischemic disease. No acute intracranial abnormalities. Specifically, no evidence of acute intracranial hemorrhage, no definite findings of acute/subacute cerebral ischemia, no mass, mass effect, hydrocephalus or abnormal intra or extra-axial fluid collections. Visualized paranasal sinuses and mastoids are well pneumatized. No acute displaced skull fractures are identified. IMPRESSION: 1. No acute intracranial abnormalities. 2. Mild cerebral atrophy with chronic microvascular ischemic changes throughout the cerebral white matter bilaterally, as above. Electronically Signed   By: Vinnie Langton M.D.   On: 09/27/2015 20:24   Mr Brain Wo Contrast  09/28/2015  CLINICAL DATA:  Headache and slurred speech. Fall 2 weeks ago with facial bruising. EXAM: MRI HEAD WITHOUT CONTRAST TECHNIQUE: Multiplanar, multiecho pulse  sequences of the brain and surrounding structures were obtained without intravenous contrast. COMPARISON:  Head CT 09/27/2015 FINDINGS: Some sequences are mildly to  moderately motion degraded. There is no evidence of acute infarct, intracranial hemorrhage, mass, midline shift, or extra-axial fluid collection. Mild generalized cerebral atrophy is within normal limits for age. Foci of T2 hyperintensity in the subcortical and deep cerebral white matter bilaterally and pons are compatible with mild-to-moderate chronic small vessel ischemic disease. A chronic lacunar infarct is noted in the left thalamus. Prior bilateral cataract extraction is noted. Paranasal sinuses are clear. There is a small right mastoid effusion. Major intracranial vascular flow voids are preserved. IMPRESSION: 1. No acute intracranial abnormality. 2. Mild-to-moderate chronic small vessel ischemic disease. Electronically Signed   By: Logan Bores M.D.   On: 09/28/2015 12:01    Micro Results    No results found for this or any previous visit (from the past 240 hour(s)).     Today   Subjective:   Zyniah Wenz today has no headache,no chest abdominal pain,no new weakness tingling or numbness, feels much better wants to go home today.   Objective:   Blood pressure 161/79, pulse 79, temperature 97.5 F (36.4 C), temperature source Oral, resp. rate 19, height 5\' 1"  (1.549 m), weight 65.772 kg (145 lb), SpO2 98 %.   Intake/Output Summary (Last 24 hours) at 09/29/15 1421 Last data filed at 09/29/15 1341  Gross per 24 hour  Intake    120 ml  Output    850 ml  Net   -730 ml    Exam Awake Alert, Oriented x 3, No new F.N deficits, Normal affect [place].AT,PERRAL Supple Neck,No JVD, No cervical lymphadenopathy appriciated.  Symmetrical Chest wall movement, Good air movement bilaterally, CTAB RRR,No Gallops,Rubs or new Murmurs, No Parasternal Heave +ve B.Sounds, Abd Soft, Non tender, No organomegaly appriciated, No rebound -guarding or rigidity. No Cyanosis, Clubbing or edema,   Data Review   CBC w Diff:  Lab Results  Component Value Date   WBC 5.4 09/27/2015   HGB 11.5* 09/27/2015     HCT 34.9* 09/27/2015   PLT 174 09/27/2015   LYMPHOPCT 27 09/27/2015   MONOPCT 7 09/27/2015   EOSPCT 3 09/27/2015   BASOPCT 0 09/27/2015    CMP:  Lab Results  Component Value Date   NA 134* 09/27/2015   K 4.0 09/27/2015   CL 102 09/27/2015   CO2 27 09/27/2015   BUN 17 09/27/2015   CREATININE 0.68 09/27/2015   PROT 6.9 09/27/2015   ALBUMIN 3.9 09/27/2015   BILITOT 0.4 09/27/2015   ALKPHOS 100 09/27/2015   AST 22 09/27/2015   ALT 19 09/27/2015  .   Total Time in preparing paper work, data evaluation and todays exam - 35 minutes  Jenniferann Stuckert M.D on 09/29/2015 at 2:21 PM  [facility]   Office  [phone]

## 2015-09-29 NOTE — Progress Notes (Signed)
Discharge instructions and medications discussed with patient.  All questions answered.  

## 2015-09-29 NOTE — Care Management Note (Signed)
Case Management Note  Patient Details  Name: Rebekah Williams MRN: [id number] Date of Birth: January 23, 1917  Subjective/Objective:  Date: 09/29/15 Spoke with patient at the bedside along with Dyann Kief and daughter n law Kennyth Lose. Introduced self as Tourist information centre manager and explained role in discharge planning and how to be reached.  Verified patient lives in [place], alone . Expressed potential need for no other DME. Verified patient anticipates to go home alone at time of discharge and will have full-time  supervision by family friends neighbors at this time to best of their knowledge. Patient denied needing help with their medication. Patient drives  to MD appointments.  Verified patient has PCP Yong Channel.   Plan: CM will continue to follow for discharge planning and [facility] resources.                   Action/Plan:   Expected Discharge Date:                  Expected Discharge Plan:  Home/Self Care  In-House Referral:     Discharge planning Services  CM Consult  Post Acute Care Choice:    Choice offered to:     DME Arranged:    DME Agency:     HH Arranged:    [place] Agency:     Status of Service:  Completed, signed off  Medicare Important Message Given:    Date Medicare IM Given:    Medicare IM give by:    Date Additional Medicare IM Given:    Additional Medicare Important Message give by:     If discussed at [place] of Stay Meetings, dates discussed:    Additional Comments:  Zenon Mayo, RN 09/29/2015, 2:39 PM

## 2015-09-29 NOTE — Evaluation (Signed)
Physical Therapy Evaluation Patient Details Name: Rebekah Williams MRN: [id number] DOB: Jul 21, 1917 Today's Date: 09/29/2015   History of Present Illness  19 pleasant female with history of migraines, HTN, DM who was transferred from outside hospital for TIA observation. She was in her usual state of health until past afternoon when she started having frontal headache bilaterally, blurry vision and slurred speech. This lasted couple of hours before it was resolved.   Clinical Impression  Pt doing well with mobility and no further PT needed.  Ready for dc from PT standpoint.      Follow Up Recommendations No PT follow up    Equipment Recommendations  None recommended by PT    Recommendations for Other Services       Precautions / Restrictions Precautions Precautions: Fall Restrictions Weight Bearing Restrictions: No      Mobility  Bed Mobility Overal bed mobility: Independent                Transfers Overall transfer level: Modified independent Equipment used: None                Ambulation/Gait Ambulation/Gait assistance: Modified independent (Device/Increase time) Ambulation Distance (Feet): 600 Feet Assistive device: None Gait Pattern/deviations: WFL(Within Functional Limits)   Gait velocity interpretation: at or above normal speed for age/gender General Gait Details: Slightly unsteady in socks but with shoes pt is steady.  Stairs            Wheelchair Mobility    Modified Rankin (Stroke Patients Only) Modified Rankin (Stroke Patients Only) Pre-Morbid Rankin Score: No symptoms Modified Rankin: No symptoms     Balance Overall balance assessment: Modified Independent                                           Pertinent Vitals/Pain Pain Assessment: No/denies pain    Home Living Family/patient expects to be discharged to:: Private residence Living Arrangements: Alone Available Help at Discharge: Family;Available  PRN/intermittently Type of Home: House Home Access: Stairs to enter Entrance Stairs-Rails: Right Entrance Stairs-Number of Steps: 4 Home Layout: One level Home Equipment: None      Prior Function Level of Independence: Independent         Comments: Very active -- drives, does all IADLs, likes to dig in her garden     Hand Dominance   Dominant Hand: Right    Extremity/Trunk Assessment   Upper Extremity Assessment: Defer to OT evaluation           Lower Extremity Assessment: Overall WFL for tasks assessed         Communication   Communication: HOH  Cognition Arousal/Alertness: Awake/alert Behavior During Therapy: WFL for tasks assessed/performed Overall Cognitive Status: Within Functional Limits for tasks assessed                      General Comments      Exercises        Assessment/Plan    PT Assessment Patent does not need any further PT services  PT Diagnosis Difficulty walking   PT Problem List    PT Treatment Interventions     PT Goals (Current goals can be found in the Care Plan section) Acute Rehab PT Goals Patient Stated Goal: to go home PT Goal Formulation: All assessment and education complete, DC therapy    Frequency     Barriers to  discharge        Co-evaluation               End of Session   Activity Tolerance: Patient tolerated treatment well Patient left: in bed;with call bell/phone within reach;with bed alarm set Nurse Communication: Mobility status    Functional Assessment Tool Used: clinical judgement Functional Limitation: Mobility: Walking and moving around Mobility: Walking and Moving Around Current Status [phone]): 0 percent impaired, limited or restricted Mobility: Walking and Moving Around Goal Status [phone]): 0 percent impaired, limited or restricted Mobility: Walking and Moving Around Discharge Status [phone]): 0 percent impaired, limited or restricted    Time: 1208-1221 PT Time Calculation (min)  (ACUTE ONLY): 13 min   Charges:   PT Evaluation $Initial PT Evaluation Tier I: 1 Procedure     PT G Codes:   PT G-Codes **NOT FOR INPATIENT CLASS** Functional Assessment Tool Used: clinical judgement Functional Limitation: Mobility: Walking and moving around Mobility: Walking and Moving Around Current Status (U6333): 0 percent impaired, limited or restricted Mobility: Walking and Moving Around Goal Status (L4562): 0 percent impaired, limited or restricted Mobility: Walking and Moving Around Discharge Status [phone]): 0 percent impaired, limited or restricted    Rebekah Williams 09/29/2015, 2:02 PM [facility] PT [phone]

## 2015-09-29 NOTE — Progress Notes (Signed)
*  PRELIMINARY RESULTS* Vascular Ultrasound Carotid Duplex (Doppler) has been completed.  Preliminary findings: Bilateral: No significant (1-39%) ICA stenosis. Antegrade vertebral flow. >50% right ECA stenosis.    Landry Mellow, RDMS, RVT 09/28/15 3:45pm

## 2015-09-29 NOTE — Progress Notes (Signed)
Occupational Therapy Evaluation Patient Details Name: Rebekah Williams MRN: [id number] DOB: 12-09-1916 Today's Date: 09/29/2015    History of Present Illness 89 pleasant female with history of migraines, HTN, DM who was transferred from outside hospital for TIA observation. She was in her usual state of health until past afternoon when she started having frontal headache bilaterally, blurry vision and slurred speech. This lasted couple of hours before it was resolved.    Clinical Impression   Patient presents to OT at baseline function for ADLs/functional transfers. No further OT needs. Will sign off.    Follow Up Recommendations  No OT follow up;Other (comment) (family to check in on patient PRN as PTA)    Equipment Recommendations  Other (comment);Tub/shower seat (son to purchase patient a shower chair)    Recommendations for Other Services       Precautions / Restrictions Precautions Precautions: Fall Restrictions Weight Bearing Restrictions: No      Mobility Bed Mobility Overal bed mobility: Independent                Transfers Overall transfer level: Modified independent Equipment used: None                  Balance                                            ADL Overall ADL's : Modified independent                                       General ADL Comments: Patient able to doff socks and don and tie tennis shoes, bathe herself, ambulate no AD, toilet.      Vision     Perception     Praxis      Pertinent Vitals/Pain Pain Assessment: No/denies pain     Hand Dominance Right   Extremity/Trunk Assessment Upper Extremity Assessment Upper Extremity Assessment: Overall WFL for tasks assessed   Lower Extremity Assessment Lower Extremity Assessment: Defer to PT evaluation       Communication Communication Communication: HOH   Cognition Arousal/Alertness: Awake/alert Behavior During Therapy: WFL for  tasks assessed/performed Overall Cognitive Status: Within Functional Limits for tasks assessed                     General Comments       Exercises       Shoulder Instructions      Home Living Family/patient expects to be discharged to:: Private residence Living Arrangements: Alone Available Help at Discharge: Family;Available PRN/intermittently Type of Home: House Home Access: Stairs to enter [employer] of Steps: 4 Entrance Stairs-Rails: Right Home Layout: One level     Bathroom Shower/Tub: Occupational psychologist: Standard     Home Equipment: None          Prior Functioning/Environment Level of Independence: Independent        Comments: Very active -- drives, does all IADLs, likes to dig in her garden    OT Diagnosis: Generalized weakness   OT Problem List: Decreased activity tolerance   OT Treatment/Interventions:      OT Goals(Current goals can be found in the care plan section) Acute Rehab OT Goals Patient Stated Goal: to go home OT Goal Formulation: All assessment and  education complete, DC therapy  OT Frequency:     Barriers to D/C:            Co-evaluation              End of Session Nurse Communication: Mobility status  Activity Tolerance: Patient tolerated treatment well Patient left: Other (comment) (in care of PT to perform PT eval)   Time: 2778-2423 OT Time Calculation (min): 20 min Charges:  OT General Charges $OT Visit: 1 Procedure OT Evaluation $Initial OT Evaluation Tier I: 1 Procedure G-Codes: OT G-codes **NOT FOR INPATIENT CLASS** Functional Assessment Tool Used: clinical judgment Functional Limitation: Self care Self Care Current Status (N3614): At least 1 percent but less than 20 percent impaired, limited or restricted Self Care Goal Status (E3154): At least 1 percent but less than 20 percent impaired, limited or restricted Self Care Discharge Status [phone]): At least 1 percent but less  than 20 percent impaired, limited or restricted  Rebekah Williams A 09/29/2015, 12:43 PM

## 2017-01-31 IMAGING — MR MR HEAD W/O CM
10 of 12 series · 35 of 48 positions shown · IV contrast (Yes    MULTIHANCE)
Comparison: Head CT 09/27/2015

CLINICAL DATA: Headache and slurred speech. Fall 2 weeks ago with
facial bruising.

EXAM:
MRI HEAD WITHOUT CONTRAST
TECHNIQUE: Multiplanar, multiecho pulse sequences of the brain and surrounding
structures were obtained without intravenous contrast.

[Series 3: T1 · sagittal · 5.0mm · 0.47mm/px · 1 of 23 slices shown]
[im 1/23]
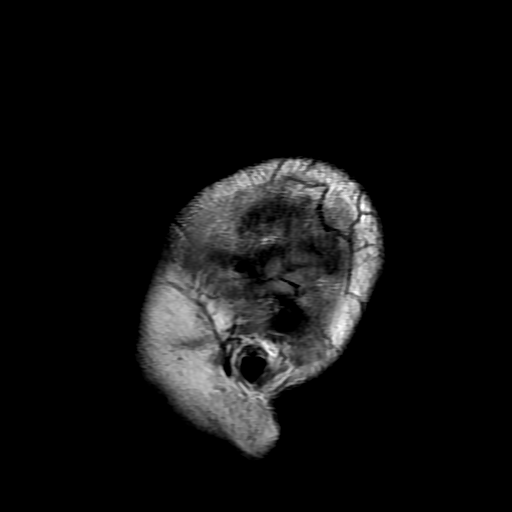

[Series 4: DWI · axial · 3.0mm · 1.09mm/px · z∈[-77,+64]mm · 9 of 96 slices shown (1 of 4)]
[im 1/96]
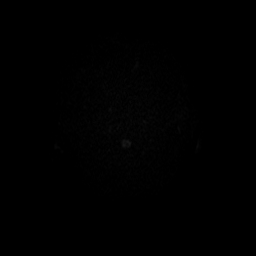
[im 12/96]
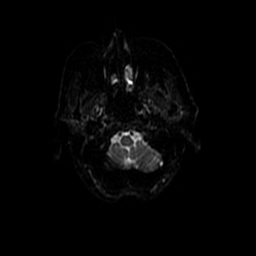
[im 24/96]
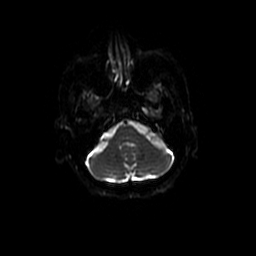
[im 36/96]
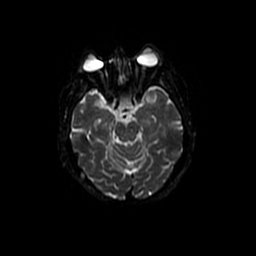
[im 48/96]
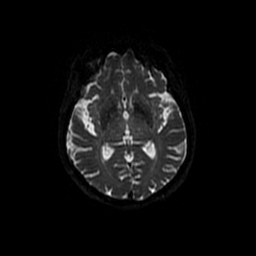
[im 60/96]
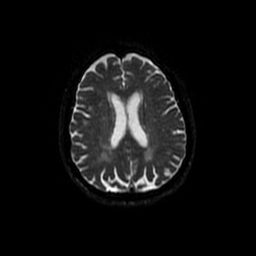
[im 72/96]
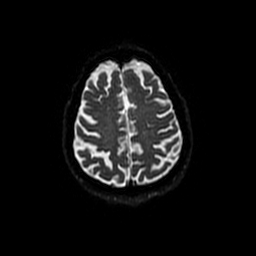
[im 84/96]
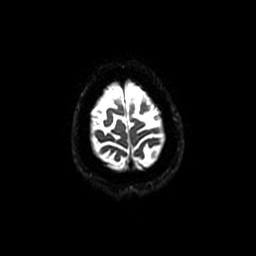
[im 96/96]
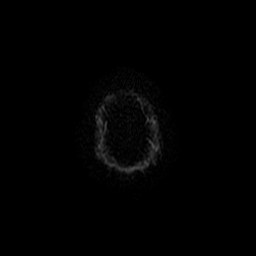

[Series 5: T2 · axial · 5.0mm · 0.43mm/px · z∈[-69,+69]mm · 2 of 24 slices shown (1 of 2)]
[im 1/24]
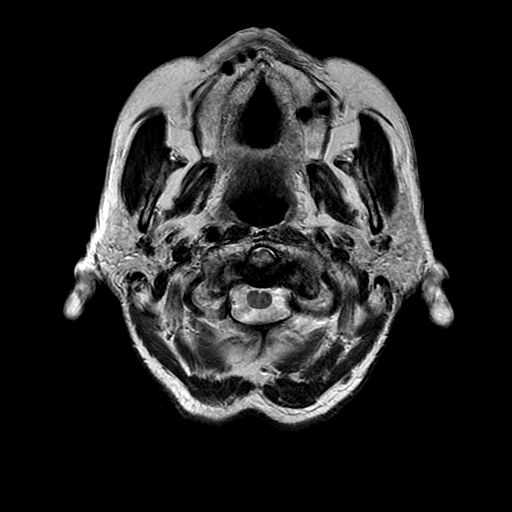
[im 24/24]
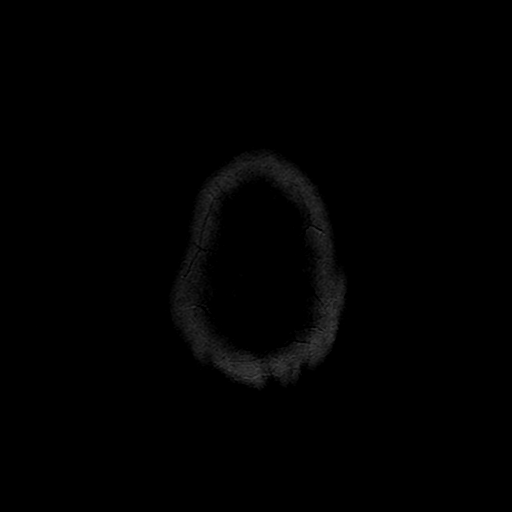

[Series 6: DWI · coronal · 5.0mm · 1.09mm/px · 6 of 64 slices shown (2 of 4)]
[im 1/64]
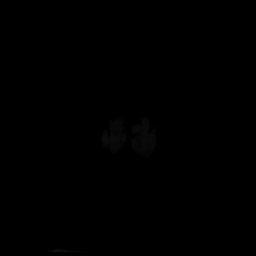
[im 13/64]
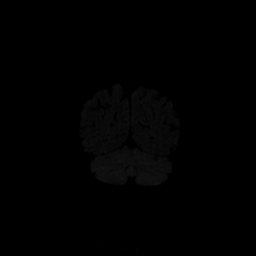
[im 26/64]
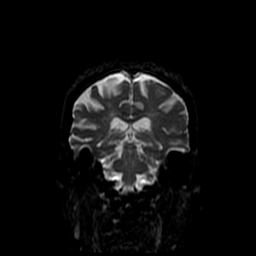
[im 38/64]
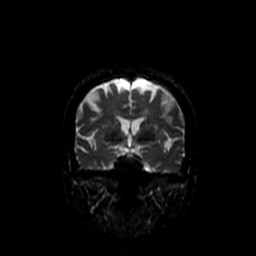
[im 51/64]
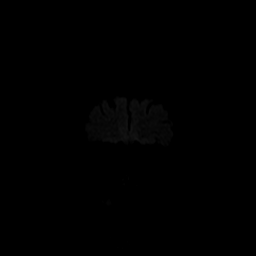
[im 64/64]
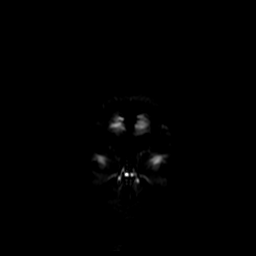

[Series 7: FLAIR · axial · 5.0mm · 0.43mm/px · z∈[-75,+65]mm · 2 of 21 slices shown (1 of 2)]
[im 1/21]
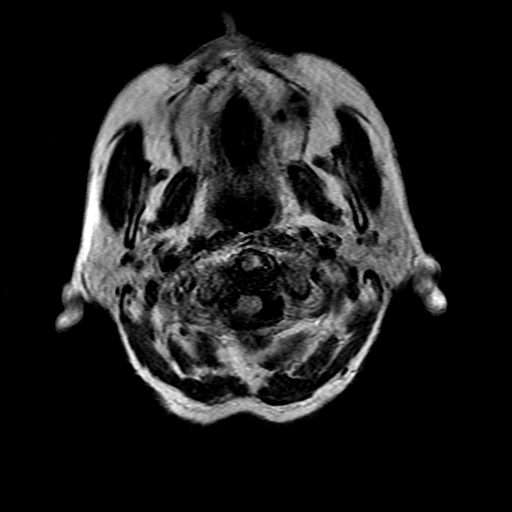
[im 21/21]
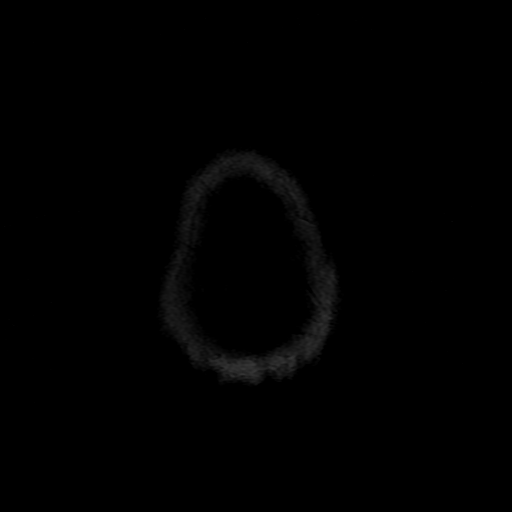

[Series 8: FLAIR · axial · 5.0mm · 0.43mm/px · z∈[-79,+59]mm · 2 of 24 slices shown (2 of 2)]
[im 1/24]
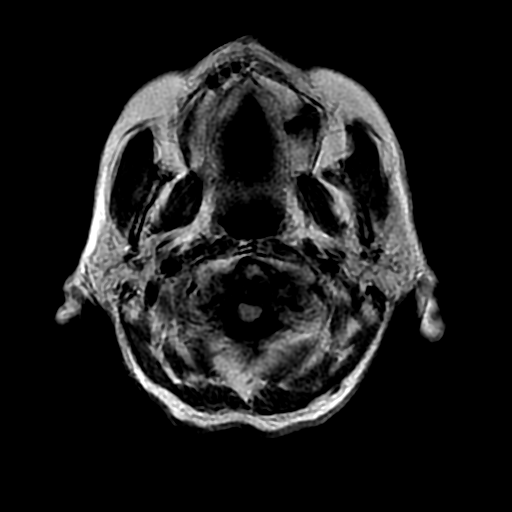
[im 24/24]
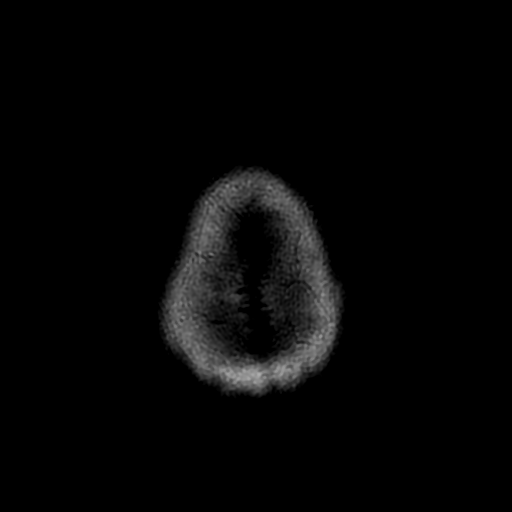

[Series 11: T2 post-contrast · coronal · 5.0mm · 0.39mm/px · 2 of 25 slices shown]
[im 1/25]
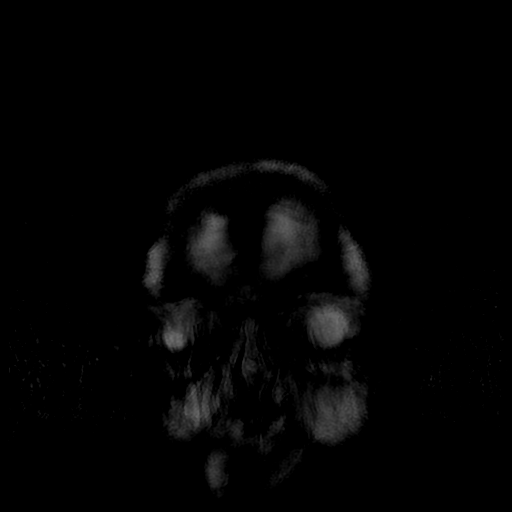
[im 25/25]
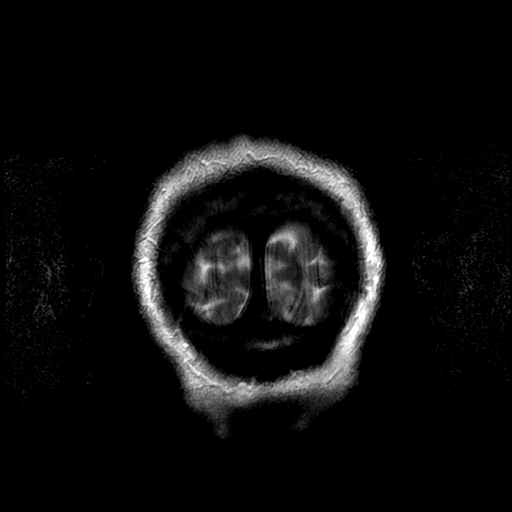

[Series 12: T2 · coronal · 5.0mm · 0.43mm/px · 3 of 28 slices shown (2 of 2)]
[im 1/28]
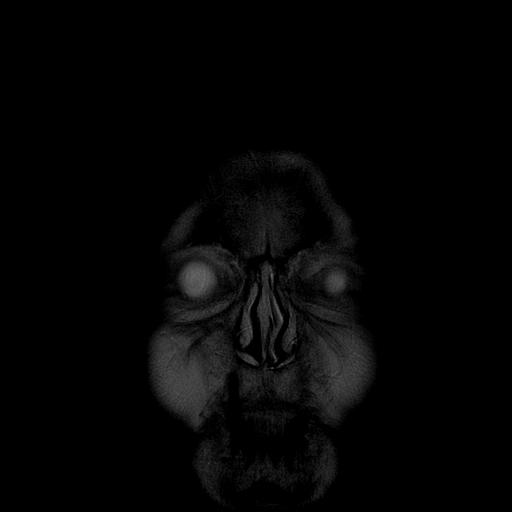
[im 14/28]
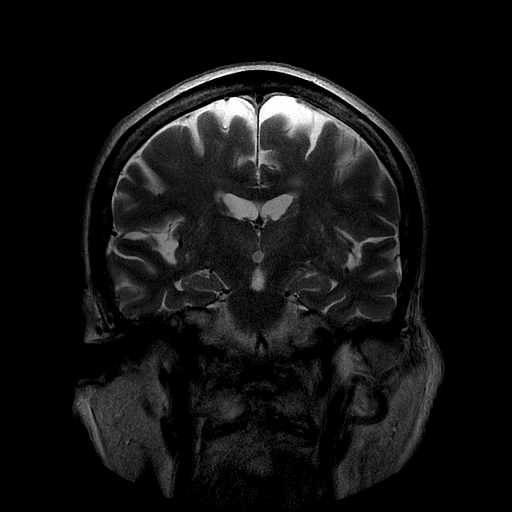
[im 28/28]
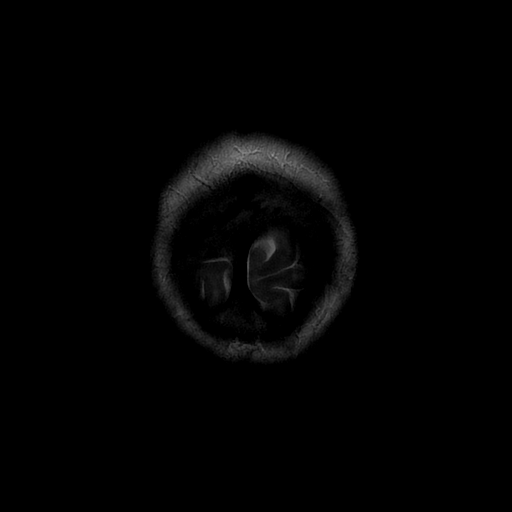

[Series 400: DWI · axial · 3.0mm · 1.09mm/px · z∈[-77,+64]mm · 5 of 48 slices shown (3 of 4)]
[im 1/48]
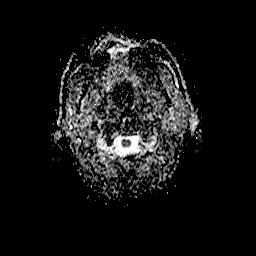
[im 12/48]
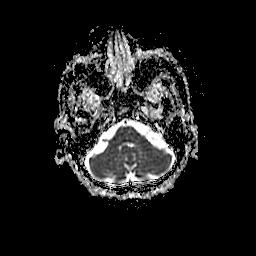
[im 24/48]
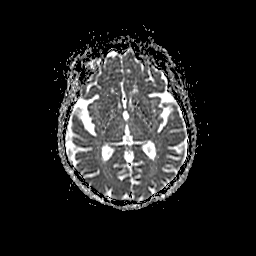
[im 36/48]
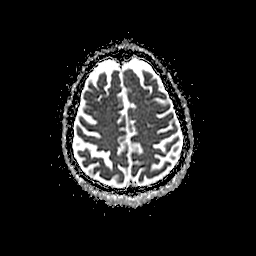
[im 48/48]
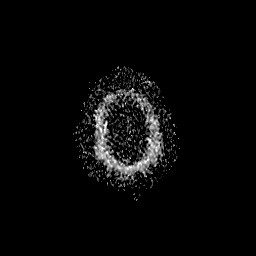

[Series 600: DWI · coronal · 5.0mm · 1.09mm/px · 3 of 32 slices shown (4 of 4)]
[im 1/32]
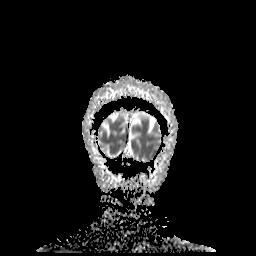
[im 16/32]
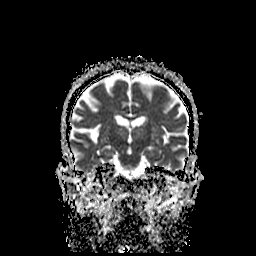
[im 32/32]
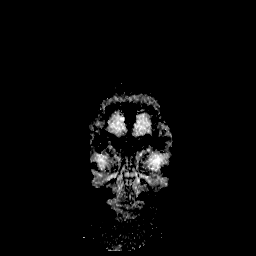

[35 of 48 positions shown; findings below may reference images not displayed]

FINDINGS: Some sequences are mildly to moderately motion degraded. There is no
evidence of acute infarct, intracranial hemorrhage, mass, midline
shift, or extra-axial fluid collection. Mild generalized cerebral
atrophy is within normal limits for age. Foci of T2 hyperintensity
in the subcortical and deep cerebral white matter bilaterally and
pons are compatible with mild-to-moderate chronic small vessel
ischemic disease. A chronic lacunar infarct is noted in the left
thalamus.

Prior bilateral cataract extraction is noted. Paranasal sinuses are
clear. There is a small right mastoid effusion. Major intracranial
vascular flow voids are preserved.
IMPRESSION: 1. No acute intracranial abnormality.
2. Mild-to-moderate chronic small vessel ischemic disease.

## 2023-05-04 DEATH — deceased
